# Patient Record
Sex: Female | Born: 1970 | Race: White | Hispanic: No | Marital: Married
Health system: Southern US, Community
[De-identification: ages and names within clinical notes are randomized; demographics above are authoritative.]

## PROBLEM LIST (undated history)

## (undated) HISTORY — PX: AUGMENTATION MAMMAPLASTY: SUR837

---

## 2017-08-27 ENCOUNTER — Ambulatory Visit
Admission: RE | Admit: 2017-08-27 | Discharge: 2017-08-27 | Disposition: A | Discharge status: Home / Self Care | Payer: BLUE CROSS/BLUE SHIELD | Source: Ambulatory Visit | Attending: Endocrinology | Admitting: Endocrinology

## 2017-08-27 ENCOUNTER — Other Ambulatory Visit: Payer: Self-pay | Admitting: Endocrinology

## 2017-08-27 DIAGNOSIS — Z1231 Encounter for screening mammogram for malignant neoplasm of breast: Secondary | ICD-10-CM

## 2017-08-29 ENCOUNTER — Other Ambulatory Visit: Payer: Self-pay | Admitting: Endocrinology

## 2017-08-29 DIAGNOSIS — R928 Other abnormal and inconclusive findings on diagnostic imaging of breast: Secondary | ICD-10-CM

## 2017-09-02 ENCOUNTER — Other Ambulatory Visit (HOSPITAL_COMMUNITY): Payer: Self-pay | Admitting: Endocrinology

## 2017-09-02 DIAGNOSIS — C73 Malignant neoplasm of thyroid gland: Secondary | ICD-10-CM

## 2017-09-08 ENCOUNTER — Encounter (HOSPITAL_COMMUNITY)
Admission: RE | Admit: 2017-09-08 | Discharge: 2017-09-08 | Disposition: A | Discharge status: Home / Self Care | Payer: BLUE CROSS/BLUE SHIELD | Source: Ambulatory Visit | Attending: Endocrinology | Admitting: Endocrinology

## 2017-09-08 DIAGNOSIS — C73 Malignant neoplasm of thyroid gland: Secondary | ICD-10-CM | POA: Insufficient documentation

## 2017-09-08 MED ORDER — STERILE WATER FOR INJECTION IJ SOLN
INTRAMUSCULAR | Status: AC
  Filled 2017-09-08: qty 10

## 2017-09-08 MED ORDER — THYROTROPIN ALFA 1.1 MG IM SOLR
0.9000 mg | INTRAMUSCULAR | Status: AC
  Administered 2017-09-08: 0.9 mg via INTRAMUSCULAR

## 2017-09-09 ENCOUNTER — Encounter (HOSPITAL_COMMUNITY): Payer: BLUE CROSS/BLUE SHIELD

## 2017-09-10 ENCOUNTER — Encounter (HOSPITAL_COMMUNITY): Payer: BLUE CROSS/BLUE SHIELD

## 2017-09-12 ENCOUNTER — Encounter (HOSPITAL_COMMUNITY): Payer: BLUE CROSS/BLUE SHIELD

## 2017-09-15 ENCOUNTER — Other Ambulatory Visit: Payer: BLUE CROSS/BLUE SHIELD

## 2017-09-15 ENCOUNTER — Inpatient Hospital Stay: Admission: RE | Admit: 2017-09-15 | Payer: BLUE CROSS/BLUE SHIELD | Source: Ambulatory Visit

## 2017-09-16 ENCOUNTER — Other Ambulatory Visit: Payer: BLUE CROSS/BLUE SHIELD

## 2017-09-22 ENCOUNTER — Other Ambulatory Visit (HOSPITAL_COMMUNITY): Payer: BLUE CROSS/BLUE SHIELD

## 2017-09-22 ENCOUNTER — Encounter (HOSPITAL_COMMUNITY): Payer: Self-pay

## 2017-09-23 ENCOUNTER — Other Ambulatory Visit (HOSPITAL_COMMUNITY): Payer: BLUE CROSS/BLUE SHIELD

## 2017-09-24 ENCOUNTER — Other Ambulatory Visit (HOSPITAL_COMMUNITY): Payer: BLUE CROSS/BLUE SHIELD

## 2017-09-26 ENCOUNTER — Other Ambulatory Visit (HOSPITAL_COMMUNITY): Payer: BLUE CROSS/BLUE SHIELD

## 2017-10-20 ENCOUNTER — Encounter (HOSPITAL_COMMUNITY)
Admission: RE | Admit: 2017-10-20 | Discharge: 2017-10-20 | Disposition: A | Discharge status: Home / Self Care | Payer: BLUE CROSS/BLUE SHIELD | Source: Ambulatory Visit | Attending: Endocrinology | Admitting: Endocrinology

## 2017-10-20 DIAGNOSIS — C73 Malignant neoplasm of thyroid gland: Secondary | ICD-10-CM | POA: Insufficient documentation

## 2017-10-20 MED ORDER — STERILE WATER FOR INJECTION IJ SOLN
INTRAMUSCULAR | Status: AC
  Administered 2017-10-20: 0.9 mL
  Filled 2017-10-20: qty 10

## 2017-10-20 MED ORDER — THYROTROPIN ALFA 1.1 MG IM SOLR
0.9000 mg | INTRAMUSCULAR | Status: AC
  Administered 2017-10-20: 0.9 mg via INTRAMUSCULAR

## 2017-10-21 ENCOUNTER — Encounter (HOSPITAL_COMMUNITY)
Admission: RE | Admit: 2017-10-21 | Discharge: 2017-10-21 | Disposition: A | Discharge status: Home / Self Care | Payer: BLUE CROSS/BLUE SHIELD | Source: Ambulatory Visit | Attending: Endocrinology | Admitting: Endocrinology

## 2017-10-21 DIAGNOSIS — C73 Malignant neoplasm of thyroid gland: Secondary | ICD-10-CM | POA: Diagnosis not present

## 2017-10-21 MED ORDER — THYROTROPIN ALFA 1.1 MG IM SOLR
0.9000 mg | INTRAMUSCULAR | Status: AC
  Administered 2017-10-21: 0.9 mg via INTRAMUSCULAR

## 2017-10-21 MED ORDER — STERILE WATER FOR INJECTION IJ SOLN
INTRAMUSCULAR | Status: AC
  Filled 2017-10-21: qty 10

## 2017-10-22 ENCOUNTER — Ambulatory Visit (HOSPITAL_COMMUNITY)
Admission: RE | Admit: 2017-10-22 | Discharge: 2017-10-22 | Disposition: A | Discharge status: Home / Self Care | Payer: BLUE CROSS/BLUE SHIELD | Source: Ambulatory Visit | Attending: Endocrinology | Admitting: Endocrinology

## 2017-10-22 DIAGNOSIS — C73 Malignant neoplasm of thyroid gland: Secondary | ICD-10-CM | POA: Diagnosis not present

## 2017-10-22 MED ORDER — SODIUM IODIDE I 131 CAPSULE
4.0800 | Freq: Once | INTRAVENOUS | Status: AC | PRN
  Administered 2017-10-22: 4.08 via ORAL

## 2017-10-24 ENCOUNTER — Ambulatory Visit (HOSPITAL_COMMUNITY)
Admission: RE | Admit: 2017-10-24 | Discharge: 2017-10-24 | Disposition: A | Discharge status: Home / Self Care | Payer: BLUE CROSS/BLUE SHIELD | Source: Ambulatory Visit | Attending: Endocrinology | Admitting: Endocrinology

## 2017-10-24 DIAGNOSIS — C73 Malignant neoplasm of thyroid gland: Secondary | ICD-10-CM | POA: Insufficient documentation

## 2017-10-24 MED ORDER — SODIUM IODIDE I 131 CAPSULE
125.2000 | Freq: Once | INTRAVENOUS | Status: AC | PRN
  Administered 2017-10-24: 125.2 via ORAL

## 2017-10-25 LAB — THYROID ANTIBODIES
THYROID PEROXIDASE ANTIBODY: 12 [IU]/mL (ref 0–34)
Thyroglobulin Antibody: 54.3 IU/mL — ABNORMAL HIGH (ref 0.0–0.9)

## 2017-10-28 ENCOUNTER — Ambulatory Visit
Admission: RE | Admit: 2017-10-28 | Discharge: 2017-10-28 | Disposition: A | Discharge status: Home / Self Care | Payer: BLUE CROSS/BLUE SHIELD | Source: Ambulatory Visit | Attending: Endocrinology | Admitting: Endocrinology

## 2017-10-28 DIAGNOSIS — R928 Other abnormal and inconclusive findings on diagnostic imaging of breast: Secondary | ICD-10-CM

## 2017-10-29 LAB — THYROGLOBULIN LEVEL: THYROGLOBULIN: 6 ng/mL

## 2018-08-20 ENCOUNTER — Other Ambulatory Visit: Payer: Self-pay | Admitting: *Deleted

## 2018-10-08 ENCOUNTER — Other Ambulatory Visit: Payer: Self-pay

## 2018-10-08 ENCOUNTER — Other Ambulatory Visit: Payer: Self-pay | Admitting: *Deleted

## 2018-10-08 ENCOUNTER — Ambulatory Visit
Admission: RE | Admit: 2018-10-08 | Discharge: 2018-10-08 | Disposition: A | Discharge status: Home / Self Care | Payer: BLUE CROSS/BLUE SHIELD | Source: Ambulatory Visit | Attending: *Deleted | Admitting: *Deleted

## 2018-10-08 DIAGNOSIS — Z1231 Encounter for screening mammogram for malignant neoplasm of breast: Secondary | ICD-10-CM

## 2019-11-19 IMAGING — MG DIGITAL SCREENING BILATERAL MAMMOGRAM WITH IMPLANTS, CAD AND TOM
9 of 12 series · 9 of 28 positions shown · non-contrast
Comparison: Previous exam(s).

CLINICAL DATA: Screening.

EXAM:
DIGITAL SCREENING BILATERAL MAMMOGRAM WITH IMPLANTS, CAD AND TOMO
The patient has retropectoral implants. Standard and implant
displaced views were performed.

[R MLO]
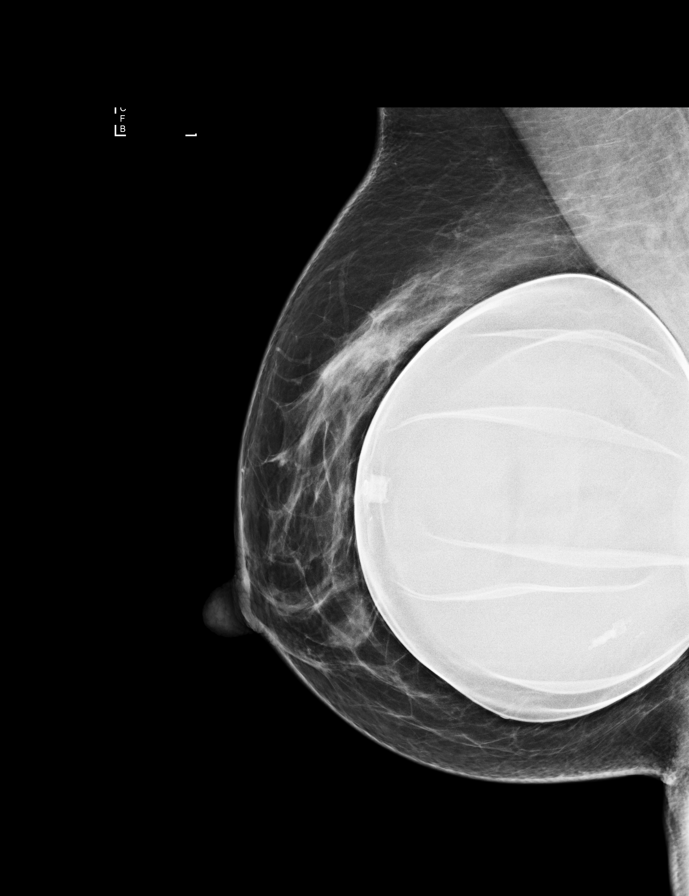

[L MLO]
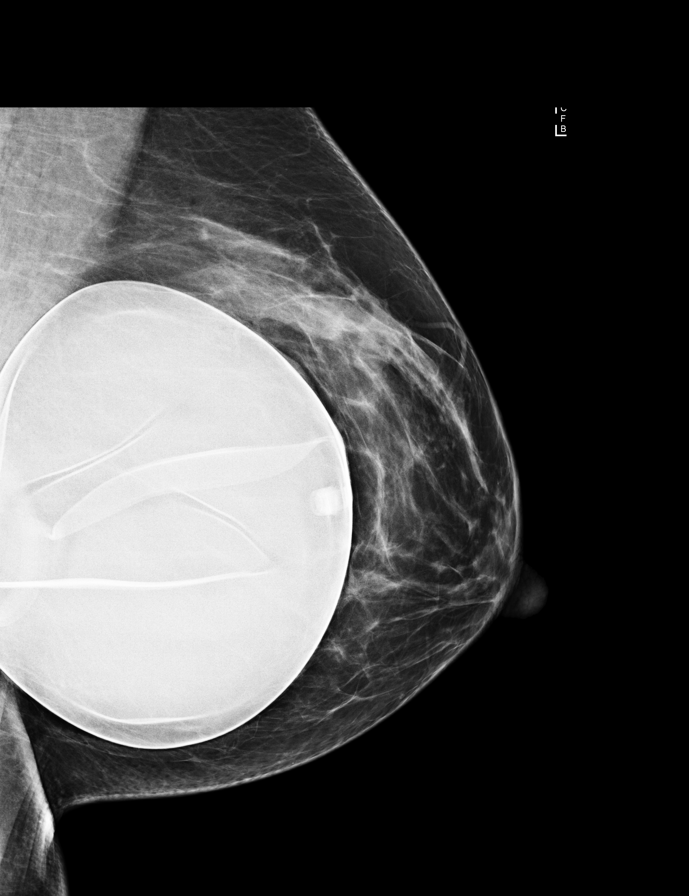

[L CC]
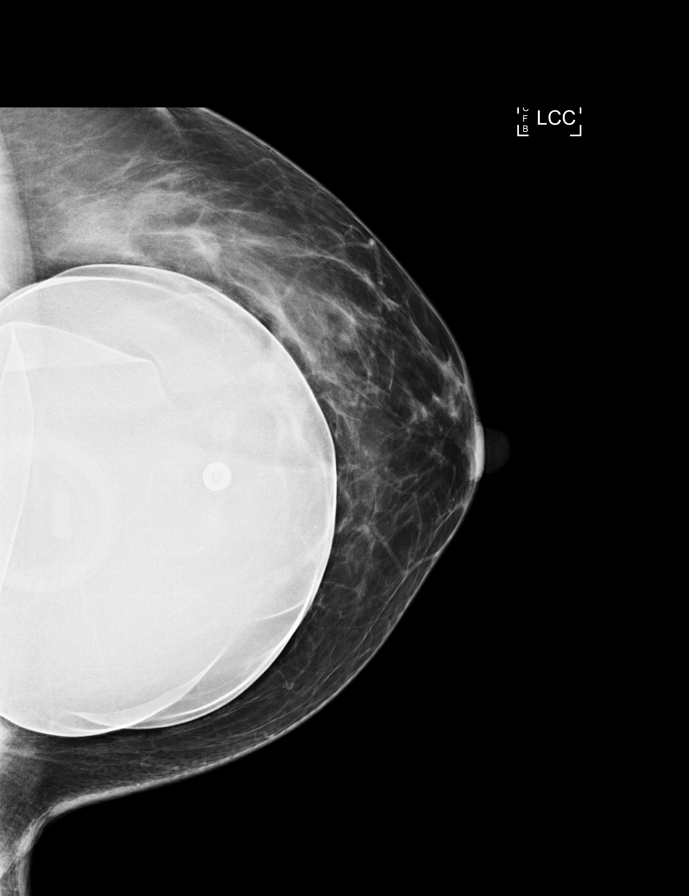

[R CC]
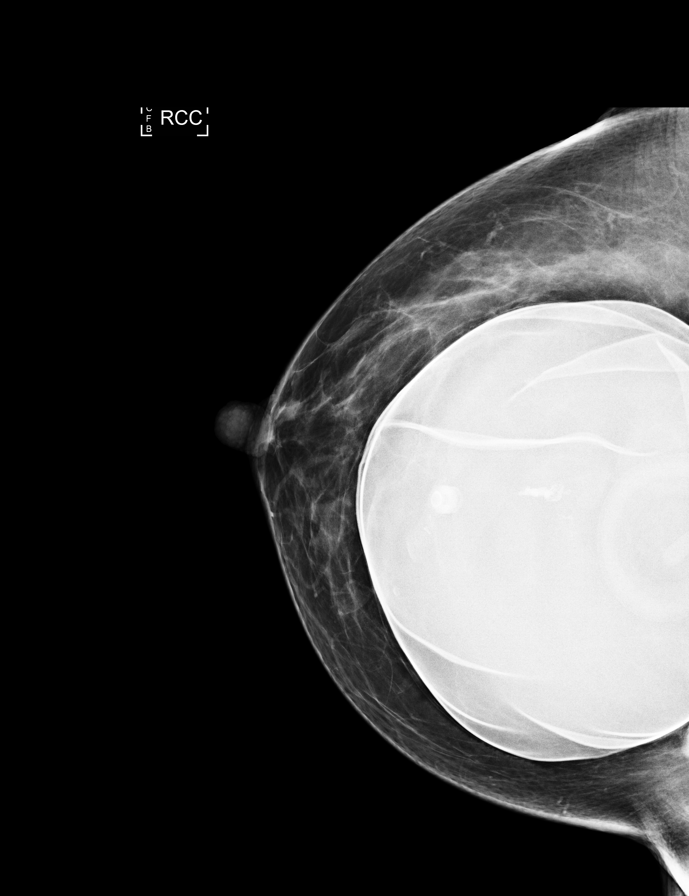

[L MLO synth-2D]
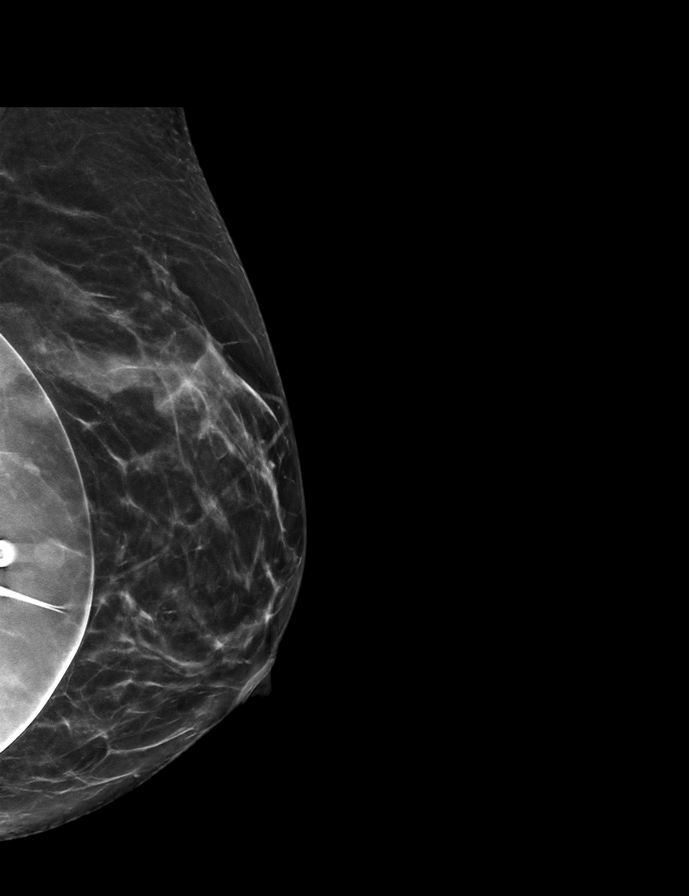

[R MLO synth-2D]
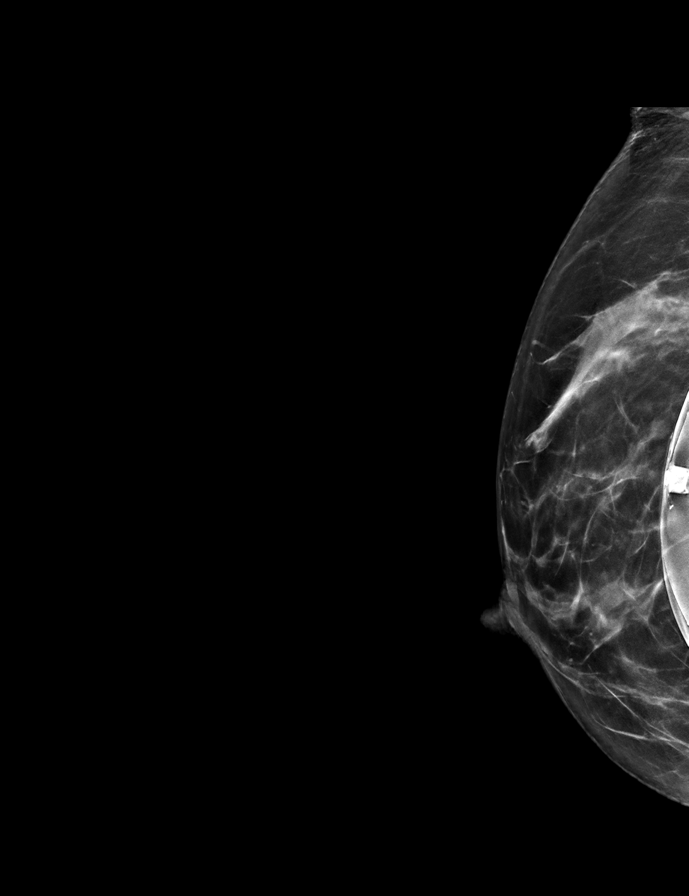

[L CC synth-2D]
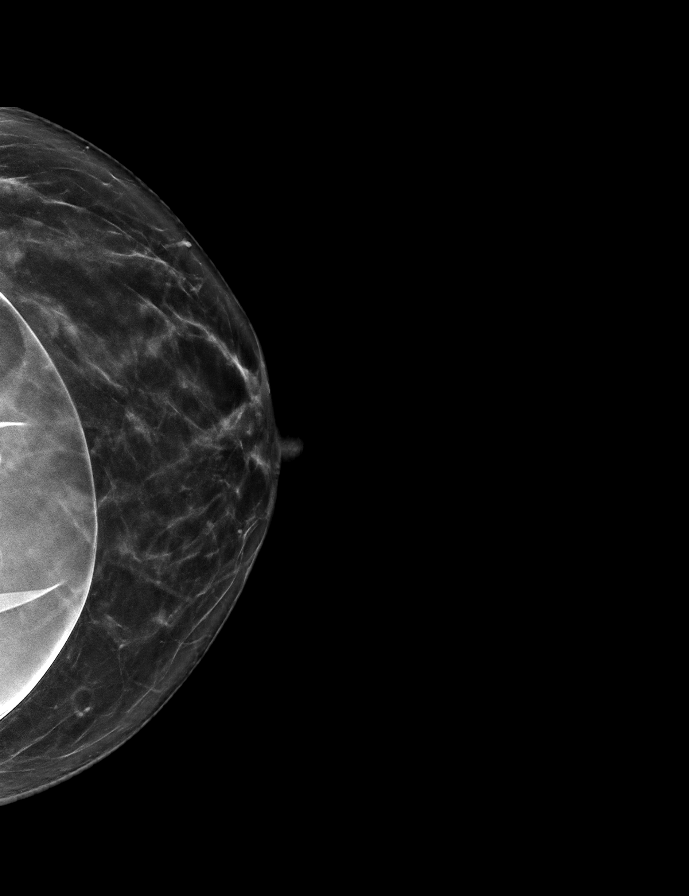

[R CC synth-2D]
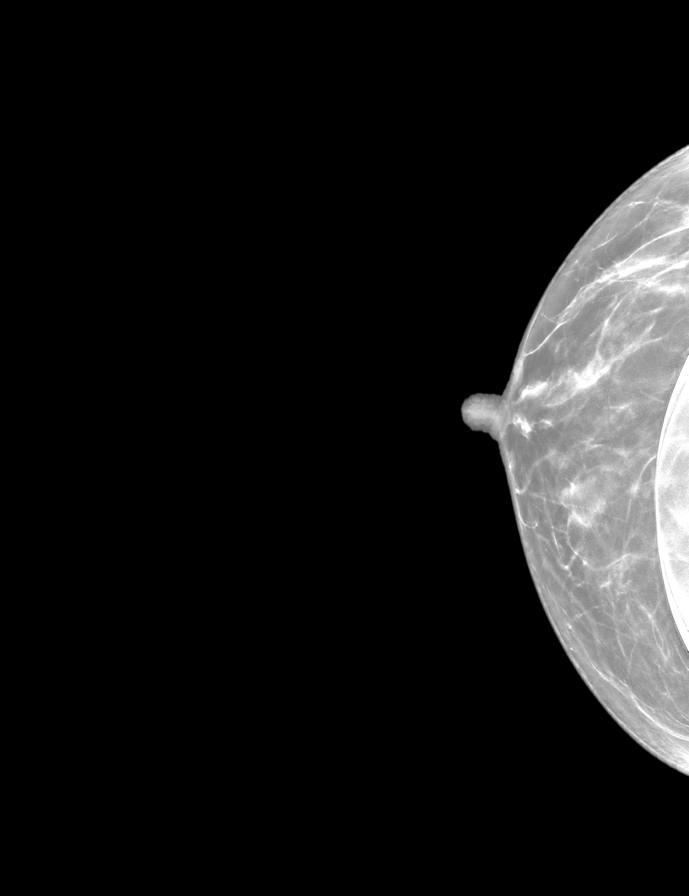

[R MLOID BREAST TOMOSYNTHESIS IMAGE tomo · tomo slice 30/59.0]
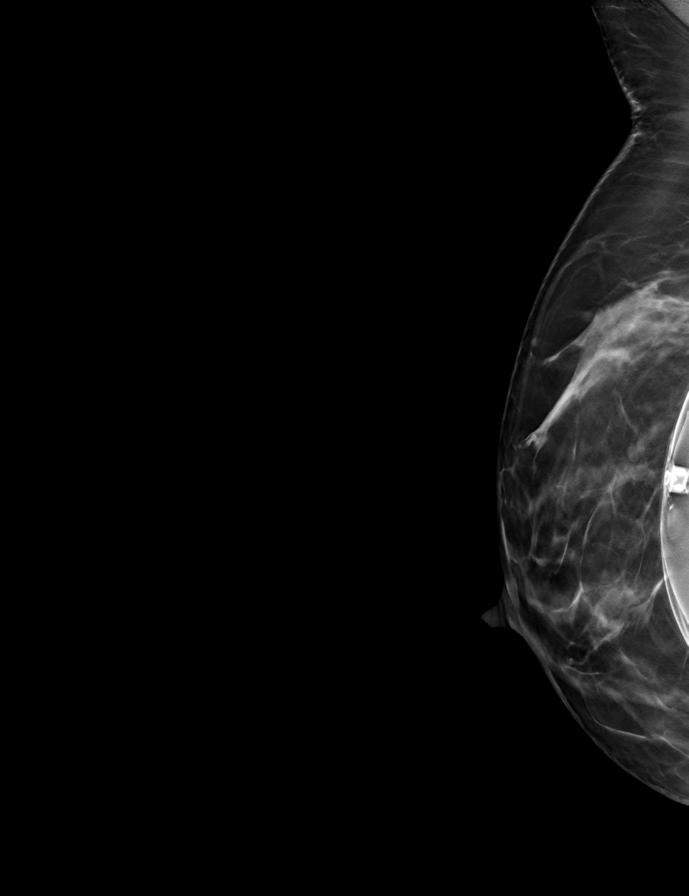

[9 of 28 positions shown; findings below may reference images not displayed]

ACR Breast Density Category c: The breast tissue is heterogeneously
dense, which may obscure small masses.
FINDINGS: There are no findings suspicious for malignancy. Images were
processed with CAD.
IMPRESSION: No mammographic evidence of malignancy. A result letter of this
screening mammogram will be mailed directly to the patient.

RECOMMENDATION:
Screening mammogram in one year. (Code:49-X-OQ9)

BI-RADS CATEGORY  1:  Negative.
